# Patient Record
Sex: Male | Born: 1965 | Race: White | Hispanic: No | Marital: Married
Health system: Southern US, Community
[De-identification: ages and names within clinical notes are randomized; demographics above are authoritative.]

---

## 2011-08-05 ENCOUNTER — Inpatient Hospital Stay: Payer: Self-pay | Admitting: Specialist

## 2011-08-05 LAB — COMPREHENSIVE METABOLIC PANEL
Albumin: 4.3 g/dL (ref 3.4–5.0)
BUN: 14 mg/dL (ref 7–18)
Chloride: 108 mmol/L — ABNORMAL HIGH (ref 98–107)
Creatinine: 1.23 mg/dL (ref 0.60–1.30)
EGFR (African American): >60
SGOT(AST): 32 U/L (ref 15–37)
SGPT (ALT): 54 U/L
Sodium: 140 mmol/L (ref 136–145)
Total Protein: 7.9 g/dL (ref 6.4–8.2)

## 2011-08-05 LAB — CBC WITH DIFFERENTIAL/PLATELET
Basophil #: 0.1 10*3/uL (ref 0.0–0.1)
Eosinophil %: 0.1 %
HCT: 43.8 % (ref 40.0–52.0)
HGB: 14.7 g/dL (ref 13.0–18.0)
Lymphocyte %: 10 %
MCH: 33 pg (ref 26.0–34.0)
MCHC: 33.5 g/dL (ref 32.0–36.0)
MCV: 99 fL (ref 80–100)
Monocyte %: 5.2 %
Neutrophil #: 11.1 10*3/uL — ABNORMAL HIGH (ref 1.4–6.5)
Neutrophil %: 84.3 %
Platelet: 235 10*3/uL (ref 150–440)

## 2011-08-05 LAB — APTT: Activated PTT: 23.3 secs — ABNORMAL LOW (ref 23.6–35.9)

## 2011-08-05 LAB — PROTIME-INR: INR: 0.9

## 2014-03-01 IMAGING — CR DG FOREARM 2V*L*
1 series · 2 of 2 positions shown · non-contrast
Comparison: none

REASON FOR EXAM: open fracture
COMMENTS:

[Series 1: ap · 0.17mm/px · 2 of 2 slices shown]
[im 1/2]
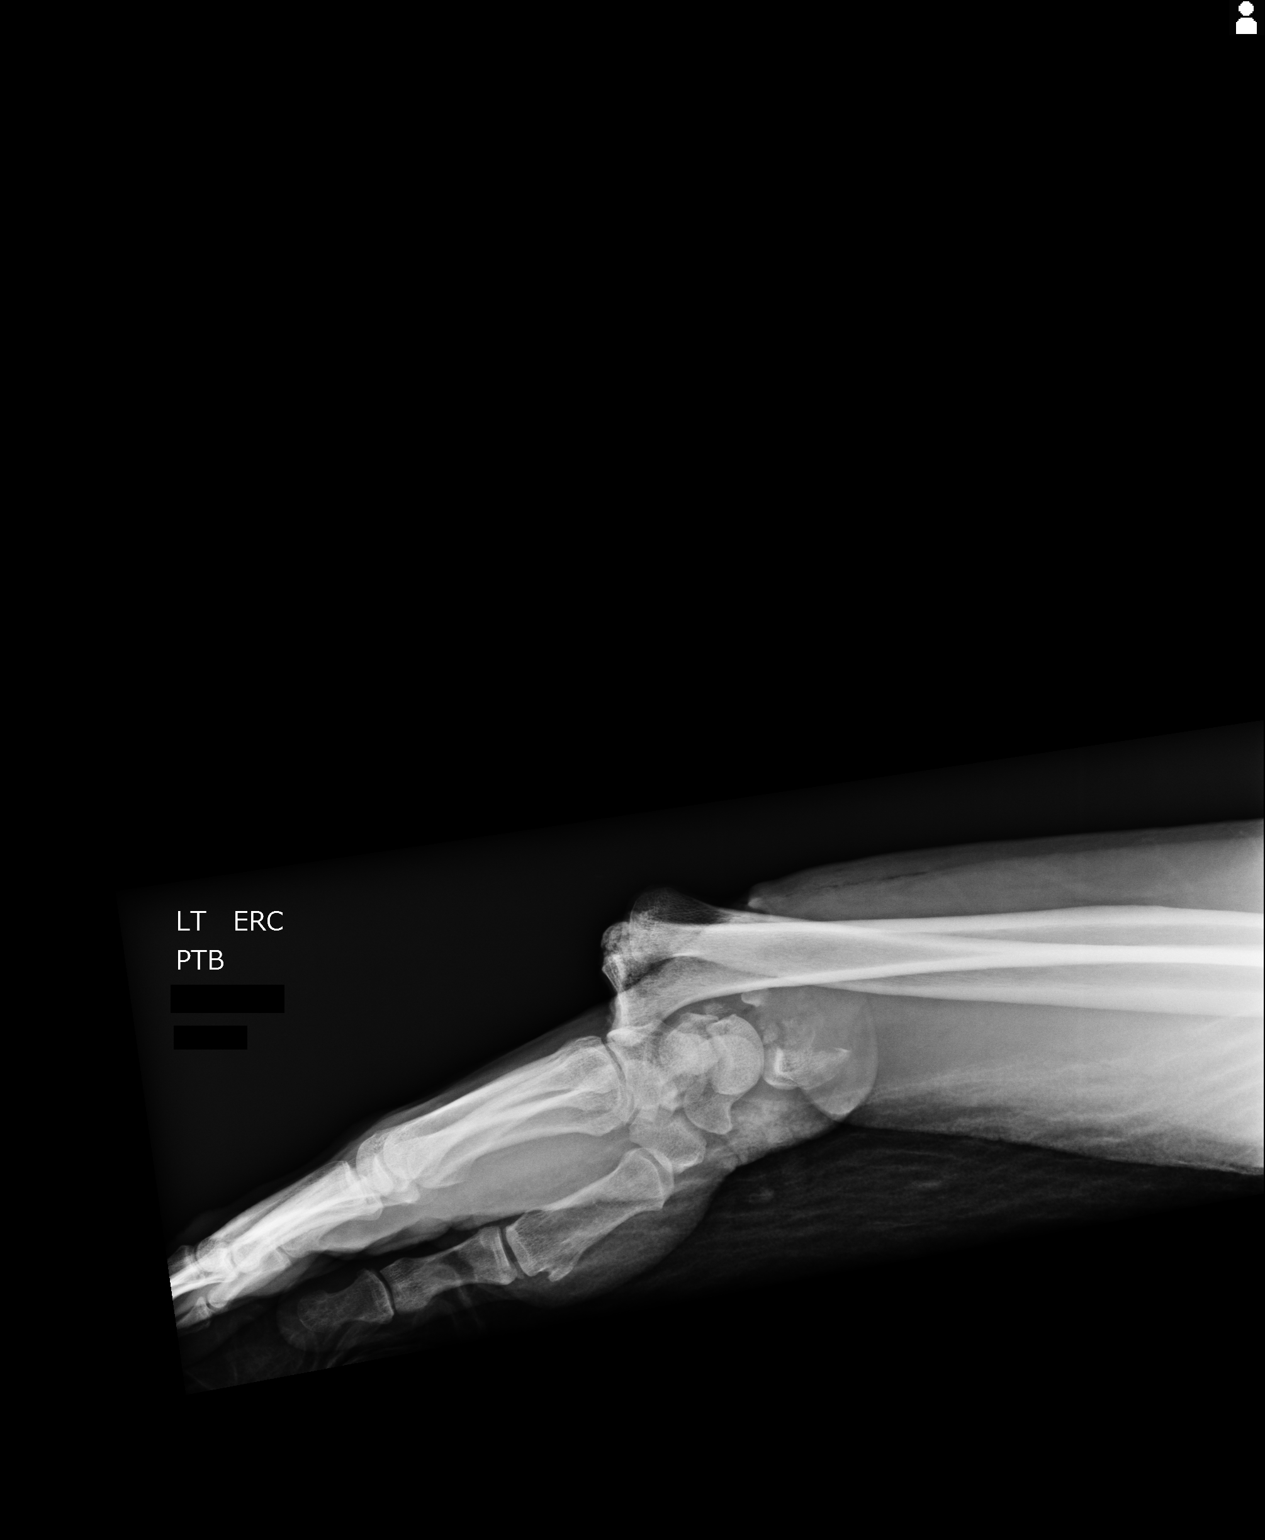
[im 2/2]
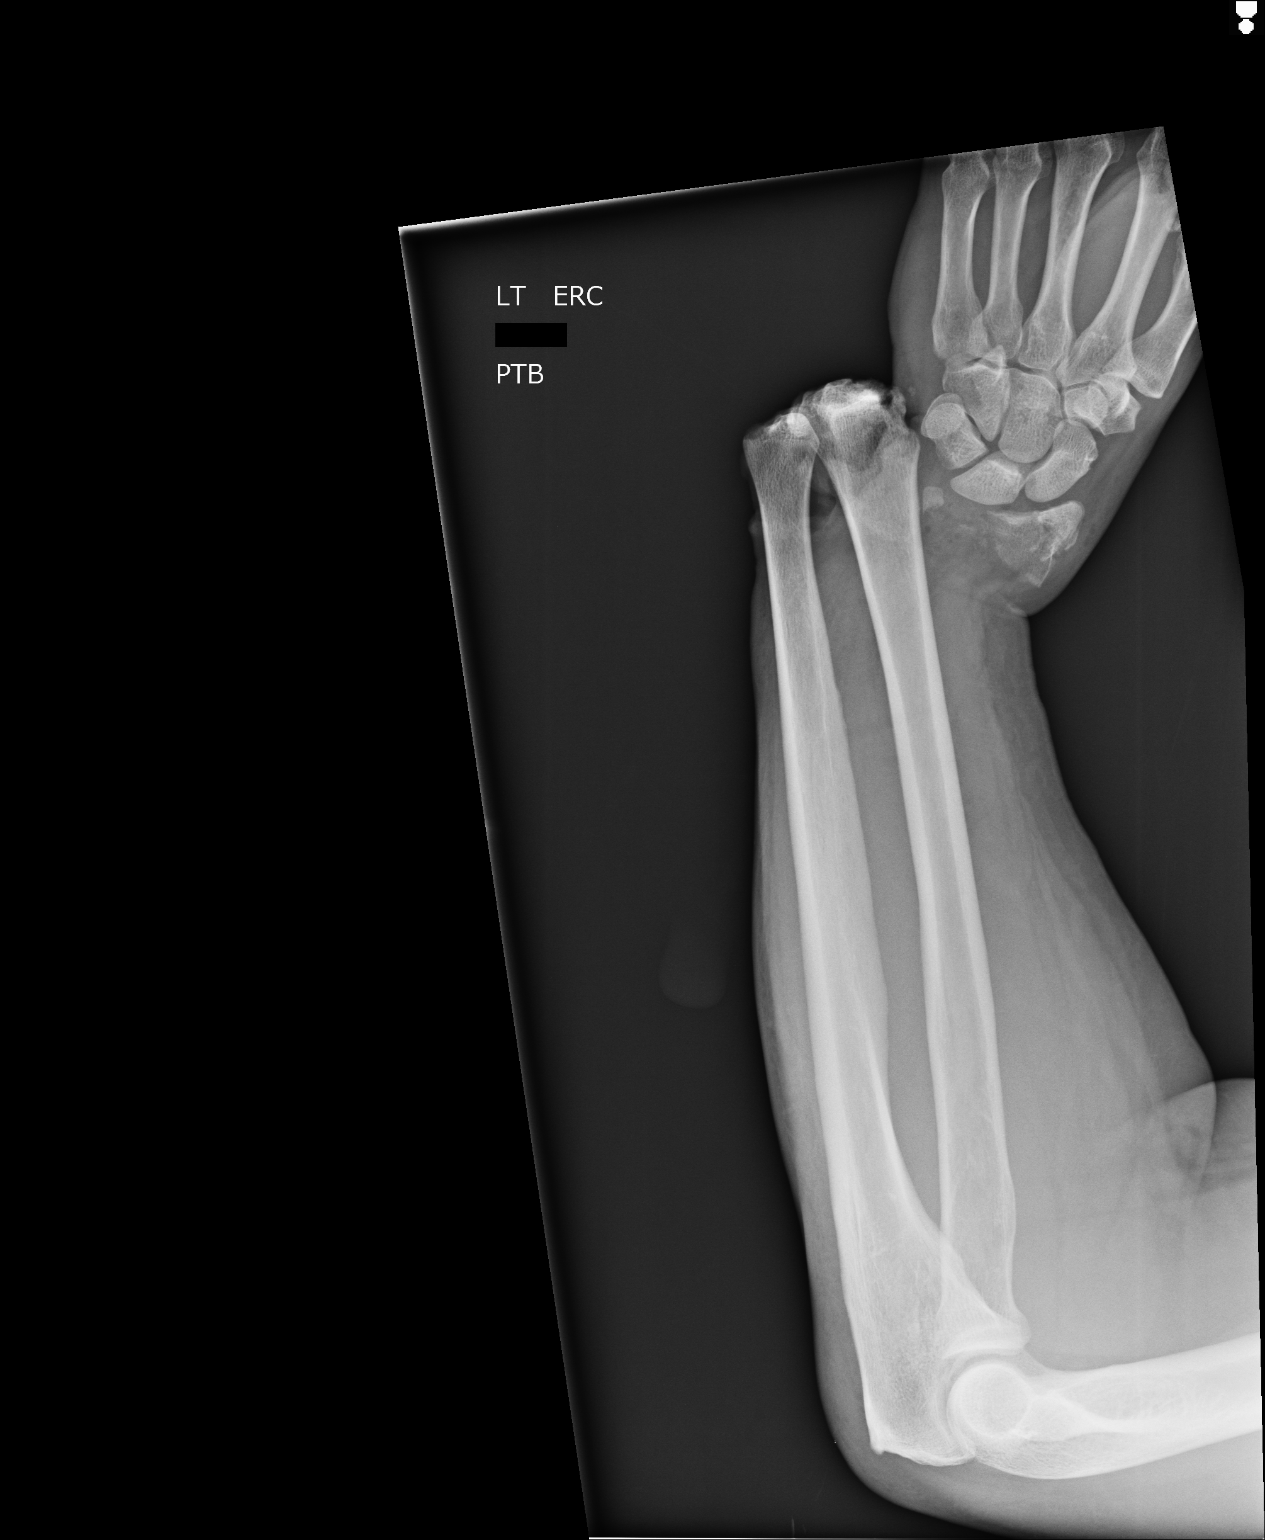

[2 of 2 positions shown; findings below may reference images not displayed]

PROCEDURE:     DXR - DXR FOREARM LEFT  - August 05, 2011  [DATE]

RESULT:     Left wrist images demonstrate compound complex comminuted
fracture dislocation with displacement of the carpus and hand anteriorly and
laterally by at least 1.5 shaft widths to 2 shaft widths. No proximal
fractures are evident. The carpus appears to be grossly intact. Irregularity
is seen in the distal radius and ulna with multiple comminuted fragments.
IMPRESSION: Compound comminuted fracture-dislocation in the left wrist
involving the distal radius and ulna.

[REDACTED]
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
## 2014-05-05 NOTE — Op Note (Signed)
PATIENT NAME:  Anthony RoachBODKIN, Stepehn MR#:  [id number] DATE OF BIRTH:  08/12/1965  DATE OF PROCEDURE:  08/05/2011  PREOPERATIVE DIAGNOSIS: Severe compound fracture-dislocation, left wrist, through the radial styloid.   POSTOPERATIVE DIAGNOSIS: Severe compound fracture-dislocation, left wrist, through the radial styloid.   PROCEDURE PERFORMED: Irrigation, debridement left compound wrist fracture with open reduction and internal fixation of the radial and ulnar styloids and application of a Synthes wrist external fixator.   SURGEON: Valinda HoarHoward E. Huckleberry Martinson, MD   ANESTHESIA: General endotracheal.   COMPLICATIONS: None.   DRAINS: None.   OPERATIVE FINDINGS: The patient had a severe fracture-dislocation of the left wrist through the radial styloid. The ulnar styloid was also avulsed. There was some particulate debris in the navicular facet of the distal radius. There were no obvious tendon or nerve lacerations. The radial pulse was intact and strong. The patient had intact sensation distally preoperatively as well as could be determined. The distal radius and ulna were stripped of soft tissue for about the distal 3 inches of their excursion.   OPERATIVE PROCEDURE: The patient was brought to the Operating Room where he underwent satisfactory general endotracheal anesthesia in the supine position. The left arm was rinsed with saline and alcohol and then prepped and draped in a sterile fashion. The tourniquet was not used. The skin edges were sharply debrided. The wound was thoroughly irrigated with 6 liters of pulsating lavage with GU antibiotic in the fluid. There was no significant particulate debris except for some small rocks or other particles impaled into the distal aspect of the radius at the scaphoid facet. These were debrided. There was some cartilage damage to the distal surface of the radius. The distal ulna had the styloid avulsed but no significant articular damage. After thorough irrigation and  debridement, the wrist was reduced. I was able to palpate the radial styloid fragment which was good size. This was examined under fluoroscopy. I was then able to pin the radial styloid in place percutaneously with a 0.62 K wire. Fluoroscopy showed this to be satisfactory in position. In order to stabilize the ulnar side, I placed a #2 Orthocord suture through the ulnar styloid fragment and the distal ulna through bone and tied this securely, and this brought the ulnar styloid into normal anatomic position. At this point, the wrist appeared to be anatomically aligned. The skin was again irrigated, and the skin was closed with a few staples. Fluoroscopy showed good positioning of the wrist at this point. I then placed four half  Schanz screws into the second metacarpal and the radius. A Synthes wrist external fixator was applied, and the wrist was placed in slight ulnar deviation without any traction on it, and the fixator was tightened snugly. Final fluoroscopy showed good alignment of the wrist. The radial K wire was bent and cut and covered with a pin cover. Dry sterile dressings were applied around the wound and the external fixator. A 3 x 12 volar fiberglass splint was placed on the wrist and forearm for protection as well. The patient had excellent radial pulse. He was then awakened and taken to recovery in good condition. In the recovery room, he awakened quickly and was able to flex and extend all fingers and felt that he had intact sensation in all fingers.   ____________________________ Valinda HoarHoward E. Langston Tuberville, MD hem:cbb D: 08/05/2011 23:07:16 ET T: 08/06/2011 15:14:36 ET JOB#: [id number]  cc: Valinda HoarHoward E. Uno Esau, MD, <Dictator> Valinda HoarHOWARD E Tajia Szeliga MD ELECTRONICALLY SIGNED 08/07/2011 13:05
